# Patient Record
Sex: Male | Born: 1965 | Hispanic: Yes | Marital: Married | Smoking: Never smoker
Health system: Southern US, Community
[De-identification: ages and names within clinical notes are randomized; demographics above are authoritative.]

---

## 2014-11-14 ENCOUNTER — Encounter: Payer: Self-pay | Admitting: Urgent Care

## 2014-11-14 ENCOUNTER — Emergency Department
Admission: EM | Admit: 2014-11-14 | Discharge: 2014-11-14 | Disposition: A | Discharge status: Home / Self Care | Payer: Medicaid - Out of State | Attending: Emergency Medicine | Admitting: Emergency Medicine

## 2014-11-14 ENCOUNTER — Emergency Department: Payer: Medicaid - Out of State

## 2014-11-14 DIAGNOSIS — M545 Low back pain, unspecified: Secondary | ICD-10-CM

## 2014-11-14 MED ORDER — KETOROLAC TROMETHAMINE 60 MG/2ML IM SOLN
INTRAMUSCULAR | Status: AC
  Administered 2014-11-14: 60 mg via INTRAMUSCULAR
  Filled 2014-11-14: qty 2

## 2014-11-14 MED ORDER — DIAZEPAM 5 MG PO TABS
5.0000 mg | ORAL_TABLET | Freq: Once | ORAL | Status: AC
  Administered 2014-11-14: 5 mg via ORAL

## 2014-11-14 MED ORDER — DIAZEPAM 5 MG PO TABS
ORAL_TABLET | ORAL | Status: AC
  Administered 2014-11-14: 5 mg via ORAL
  Filled 2014-11-14: qty 1

## 2014-11-14 MED ORDER — MELOXICAM 7.5 MG PO TABS: 7.5000 mg | ORAL_TABLET | Freq: Every day | ORAL | Status: AC

## 2014-11-14 MED ORDER — KETOROLAC TROMETHAMINE 60 MG/2ML IM SOLN
60.0000 mg | Freq: Once | INTRAMUSCULAR | Status: AC
  Administered 2014-11-14: 60 mg via INTRAMUSCULAR

## 2014-11-14 NOTE — ED Provider Notes (Signed)
[facility] Emergency Department Provider Note  ____________________________________________  Time seen: 7:21 AM  I have reviewed the triage vital signs and the nursing notes.   HISTORY  Chief Complaint Back Pain   HPI Jeremy Buck is a 49 y.o. male who came to the emergency room via EMS with complaint of back pain since yesterday. He [place] [employer] [place] and [place] his medication [place] the truck. Currently the truck is taking a load to [place]. He states he was seen at [facility] last night and given a prescription. He states "pills are not working". He is requesting a "shot". He denies any previous problems with his back. He denies any radiation into his legs, denies urinary or bladder control loss.Currently he rates his pain 10 over 10. He states that he will most likely take a taxi to the airport and fly back to [place].   History reviewed. No pertinent past medical history.  There are no active problems to display for this patient.   History reviewed. No pertinent past surgical history.  Current Outpatient Rx  Name  Route  Sig  Dispense  Refill  . meloxicam (MOBIC) 7.5 MG tablet   Oral   Take 1 tablet (7.5 mg total) by mouth daily.   14 tablet   2     Allergies Review of patient's allergies indicates no known allergies.  No family history on file.  Social History History  Substance Use Topics  . Smoking status: Never Smoker   . Smokeless tobacco: Not on file  . Alcohol Use: Yes    Review of Systems Constitutional: No fever/chills Eyes: No visual changes. ENT: No sore throat. Cardiovascular: Denies chest pain. Respiratory: Denies shortness of breath. Gastrointestinal: No abdominal pain.  No nausea, no vomiting.  Genitourinary: Negative for dysuria. Musculoskeletal: Positive for back pain. Skin: Negative for rash. Neurological: Negative for headaches, focal weakness or numbness.  10-point ROS otherwise  negative.  ____________________________________________   PHYSICAL EXAM:  VITAL SIGNS: ED Triage Vitals  Enc Vitals Group     BP 11/14/14 0134 141/84 mmHg     Pulse Rate 11/14/14 0134 90     Resp 11/14/14 0134 18     Temp 11/14/14 0134 98.3 F (36.8 C)     Temp Source 11/14/14 0134 Oral     SpO2 11/14/14 0134 98 %     Weight 11/14/14 0134 268 lb (121.564 kg)     Height 11/14/14 0134 5\' 6"  (1.676 m)     Head Cir --      Peak Flow --      Pain Score 11/14/14 0134 10     Pain Loc --      Pain Edu? --      Excl. in GC? --     Constitutional: Alert and oriented. Well appearing and in no acute distress. Eyes: Conjunctivae are normal. PERRL. EOMI. Head: Atraumatic. Nose: No congestion/rhinnorhea. Neck: No stridor.   Cardiovascular: Normal rate, regular rhythm. Grossly normal heart sounds.  Good peripheral circulation. Respiratory: Normal respiratory effort.  No retractions. Lungs CTAB. Gastrointestinal: Soft and nontender. No distention. No abdominal bruits. No CVA tenderness. Musculoskeletal: Back exam no gross deformity. Moderate tenderness on palpation of the right lower lumbosacral area and paravertebral muscles. Range of motion is slightly restricted but no muscle spasms are seen. Straight leg raises were 70 with minimal discomfort. No lower extremity tenderness nor edema.  No joint effusions. Neurologic:  Normal speech and language. No  gross focal neurologic deficits are appreciated. Speech is normal. No gait instability. Reflexes 1+ bilaterally. Skin:  Skin is warm, dry and intact. No rash noted. Psychiatric: Mood and affect are normal. Speech and behavior are normal.  ____________________________________________   LABS (all labs ordered are listed, but only abnormal results are displayed)  Labs Reviewed - No data to display  RADIOLOGY  Mild degenerative disc disease changes of the thoracic and lumbar spine per radiologist and reviewed by me. I, Tommi Rumps,  personally viewed and evaluated these images as part of my medical decision making.  ____________________________________________   PROCEDURES  Procedure(s) performed: None  Critical Care performed: No  ____________________________________________   INITIAL IMPRESSION / ASSESSMENT AND PLAN / ED COURSE  Pertinent labs & imaging results that were available during my care of the patient were reviewed by me and considered in my medical decision making (see chart for details).  Patient was sleeping when provider went to talk to him about his x-rays. He states his pain has greatly improved. His plan is to take a taxi to the airport and fly home. He is to follow-up with his doctor once he gets home. ____________________________________________   FINAL CLINICAL IMPRESSION(S) / ED DIAGNOSES  Final diagnoses:  Bilateral low back pain without sciatica      Tommi Rumps, PA-C 11/14/14 1506  Sharman Cheek, MD 11/14/14 1535

## 2014-11-14 NOTE — ED Notes (Signed)
Patient presents via EMS to tri[place] from [employer]; patient is a [employer] architect[employer]ruck driver. Patient reporting lower back pain. Patient reporting that he was seen at [facility] last night and was given a prescription - "pills will not work. I want the doctor to give me a shot."

## 2017-03-02 IMAGING — CR DG LUMBAR SPINE 2-3V
1 series · 3 of 3 positions shown · non-contrast
Comparison: None.

CLINICAL DATA: Low back pain for 48 hours, pain at L5-S1 area

EXAM:
None

[Series 1: dg lumbar spine 2-3 views · 0.14mm/px · 3 of 3 slices shown]
[im 1/3]
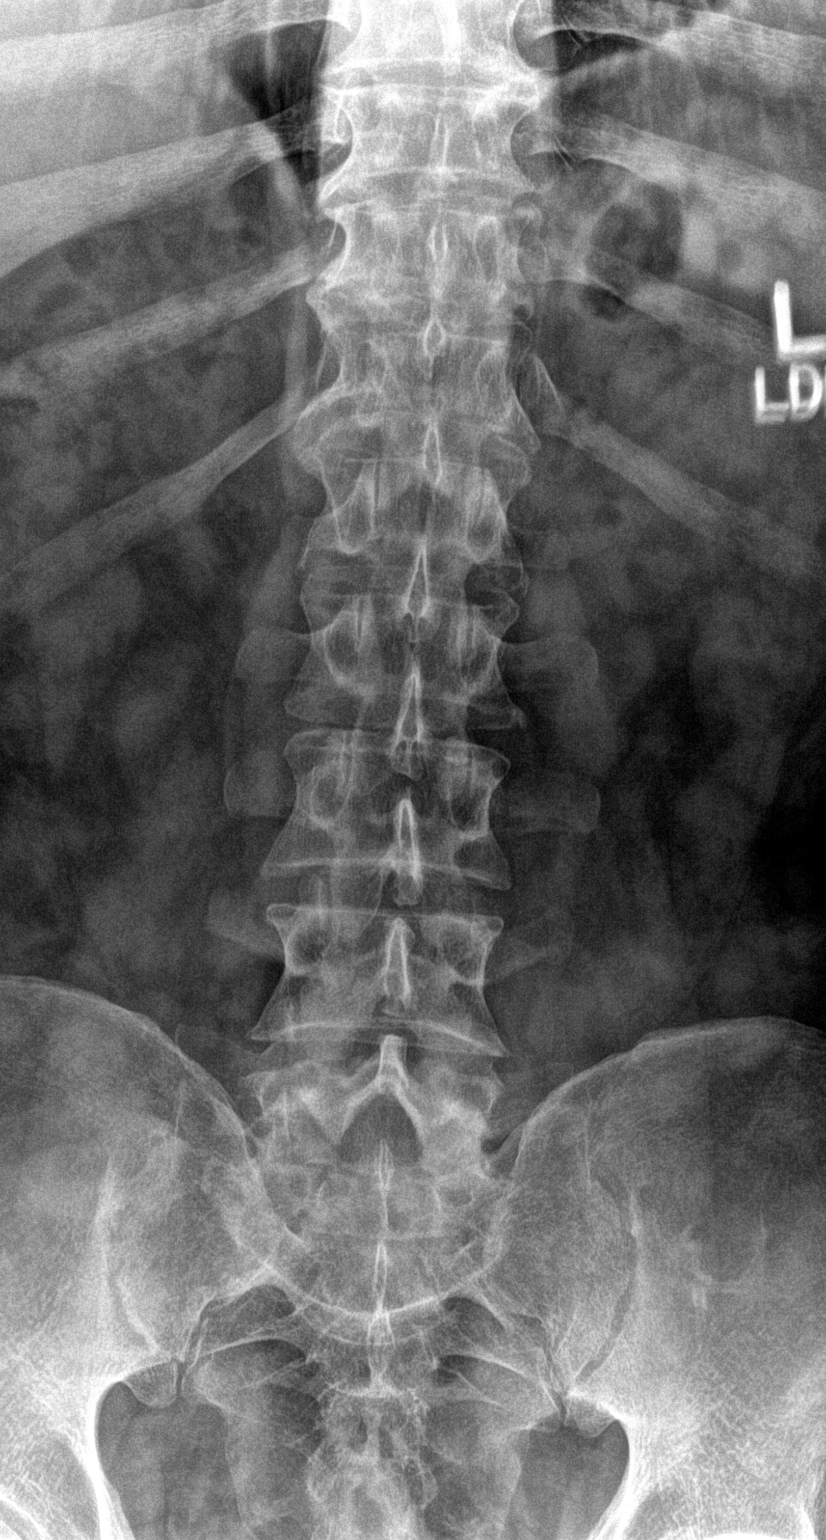
[im 2/3]
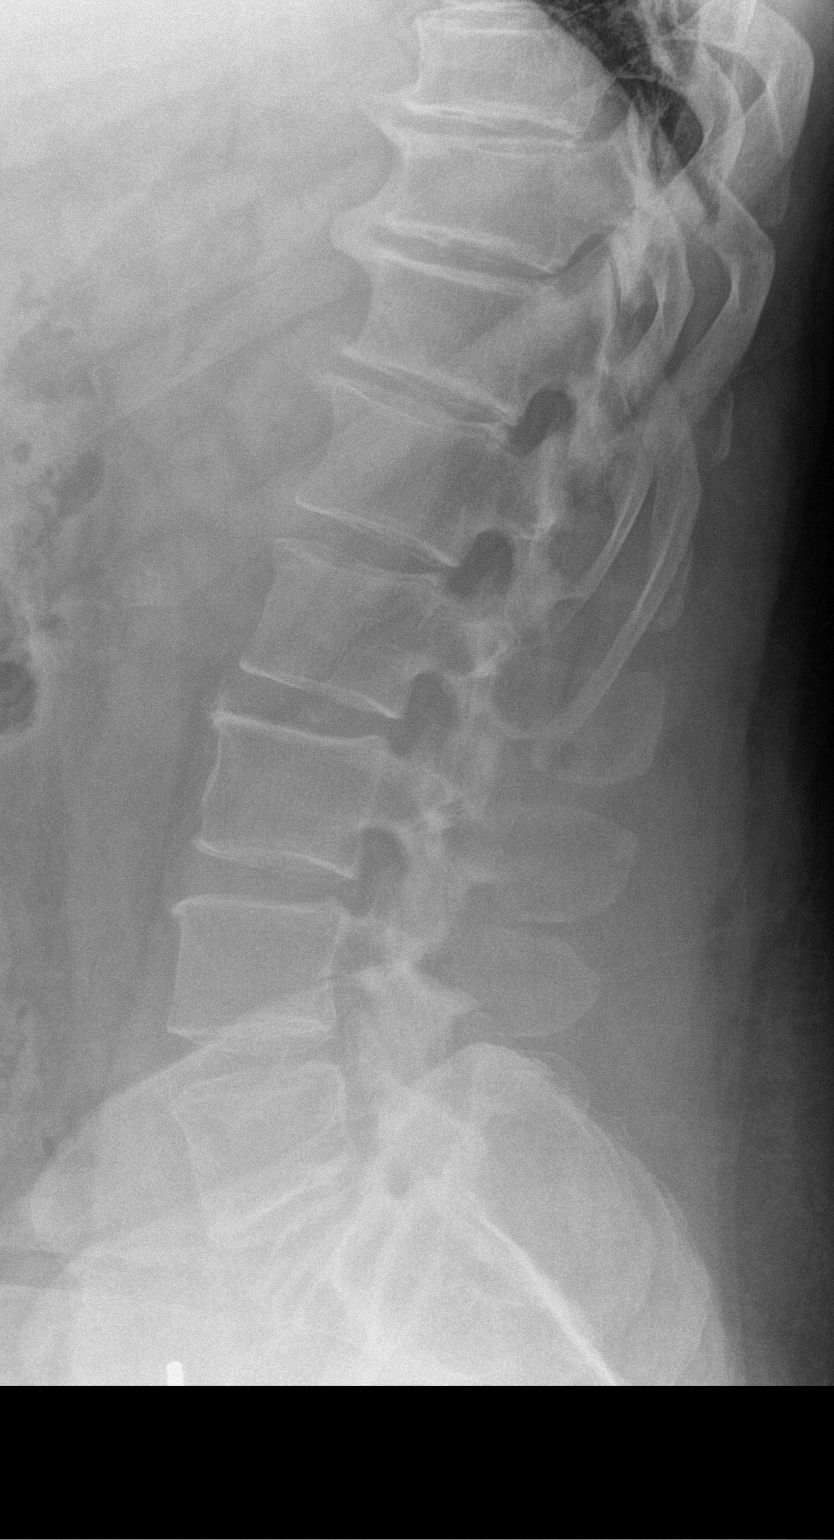
[im 3/3]
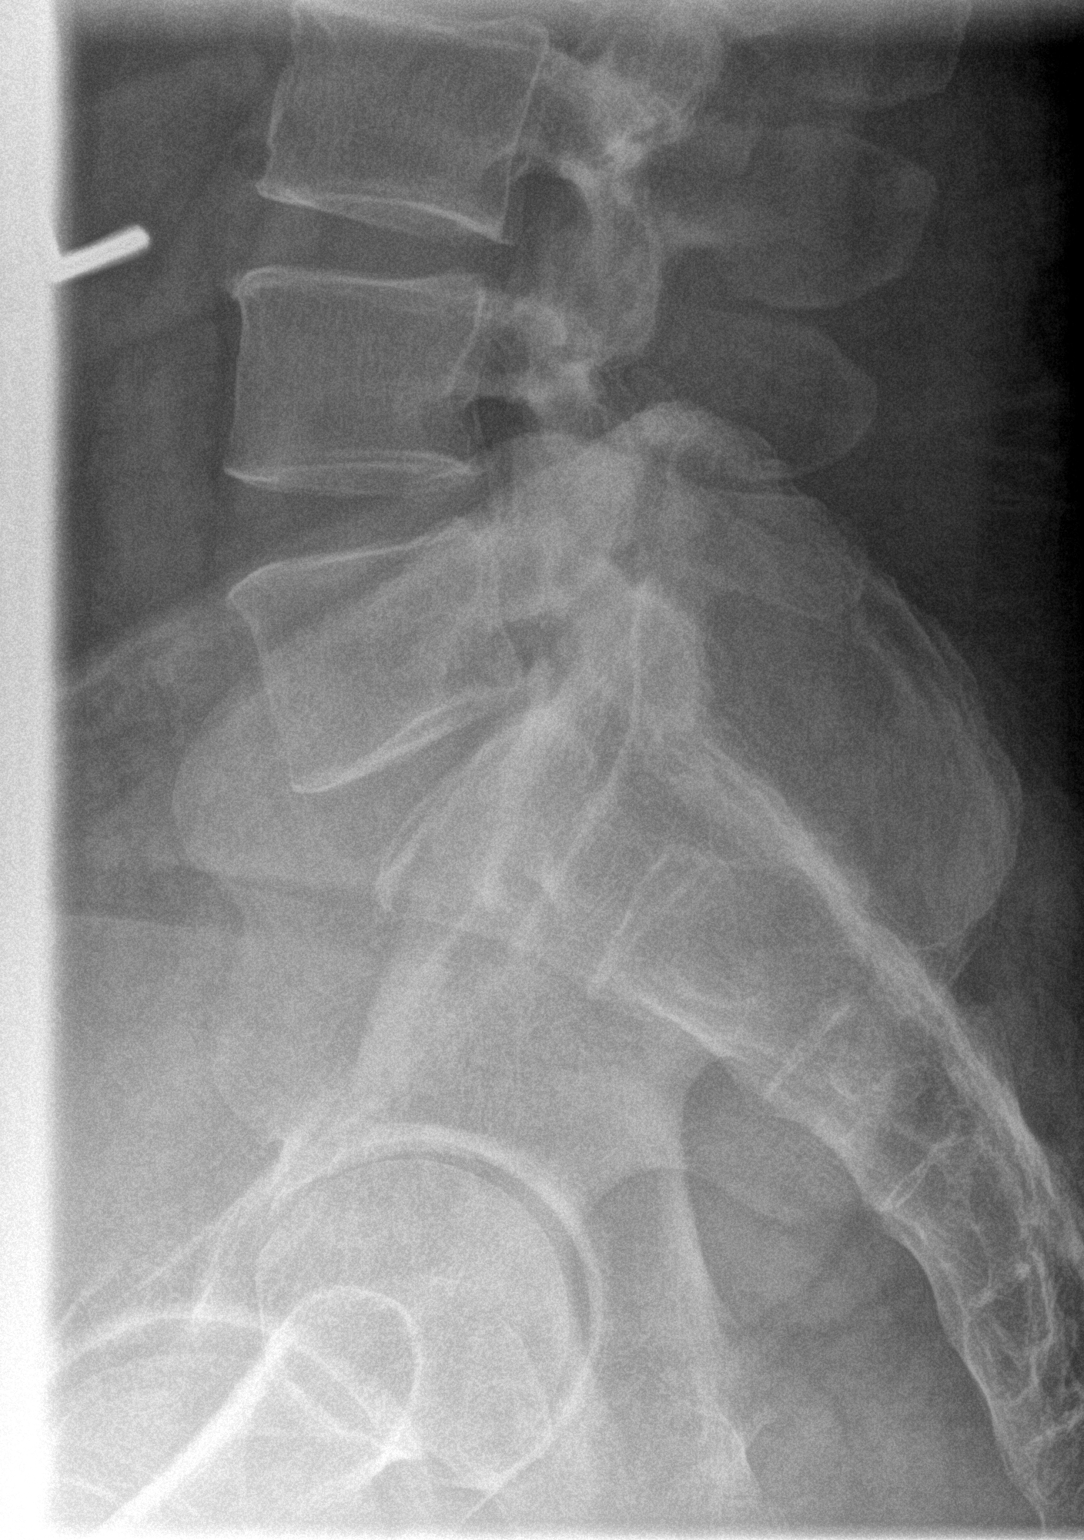

[3 of 3 positions shown; findings below may reference images not displayed]

FINDINGS: Five non-rib-bearing lumbar vertebra.

Vertebral body heights maintained.

Minimal disc space narrowing L4-L5.

Disc space narrowing with endplate spur formation at visualized
lower thoracic spine.

No acute fracture, subluxation or bone destruction.

No gross evidence of spondylolysis.

SI joints grossly preserved.
IMPRESSION: Mild degenerative disc disease changes of the thoracic and lumbar
spine as above.

No acute abnormalities.
